# Patient Record
Sex: Female | Born: 2000 | Race: White | Hispanic: No | Marital: Single | Smoking: Never smoker
Health system: Southern US, Community
[De-identification: ages and names within clinical notes are randomized; demographics above are authoritative.]

---
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
## 2020-06-02 ENCOUNTER — Other Ambulatory Visit: Payer: Self-pay | Admitting: Physician Assistant

## 2020-06-02 DIAGNOSIS — N926 Irregular menstruation, unspecified: Secondary | ICD-10-CM

## 2020-06-02 DIAGNOSIS — Z3009 Encounter for other general counseling and advice on contraception: Secondary | ICD-10-CM

## 2020-09-26 ENCOUNTER — Telehealth: Payer: Self-pay | Admitting: Neurology

## 2020-09-26 ENCOUNTER — Other Ambulatory Visit (HOSPITAL_BASED_OUTPATIENT_CLINIC_OR_DEPARTMENT_OTHER): Payer: Self-pay

## 2020-09-26 DIAGNOSIS — N926 Irregular menstruation, unspecified: Secondary | ICD-10-CM

## 2020-09-26 DIAGNOSIS — Z3009 Encounter for other general counseling and advice on contraception: Secondary | ICD-10-CM

## 2020-09-26 MED ORDER — NORETHIN ACE-ETH ESTRAD-FE 1-20 MG-MCG PO TABS
1.0000 | ORAL_TABLET | Freq: Every day | ORAL | 0 refills | Status: DC
  Filled 2020-09-26: qty 28, 28d supply, fill #0

## 2020-09-26 NOTE — Telephone Encounter (Signed)
Patient left vm asking for refill on birth control. 1 month supply sent. Not seen since October 2020. Please call patient to schedule follow up.

## 2020-09-26 NOTE — Telephone Encounter (Signed)
Patient has been scheduled for 10/03/2020. AM

## 2020-09-27 ENCOUNTER — Other Ambulatory Visit (HOSPITAL_BASED_OUTPATIENT_CLINIC_OR_DEPARTMENT_OTHER): Payer: Self-pay

## 2020-10-03 ENCOUNTER — Other Ambulatory Visit: Payer: Self-pay

## 2020-10-03 ENCOUNTER — Ambulatory Visit (INDEPENDENT_AMBULATORY_CARE_PROVIDER_SITE_OTHER): Payer: No Typology Code available for payment source | Admitting: Physician Assistant

## 2020-10-03 ENCOUNTER — Encounter: Payer: Self-pay | Admitting: Physician Assistant

## 2020-10-03 VITALS — BP 124/65 | HR 86 | Temp 98.8°F | Wt 166.0 lb

## 2020-10-03 DIAGNOSIS — R635 Abnormal weight gain: Secondary | ICD-10-CM | POA: Diagnosis not present

## 2020-10-03 DIAGNOSIS — N926 Irregular menstruation, unspecified: Secondary | ICD-10-CM

## 2020-10-03 DIAGNOSIS — E663 Overweight: Secondary | ICD-10-CM

## 2020-10-03 DIAGNOSIS — Z1329 Encounter for screening for other suspected endocrine disorder: Secondary | ICD-10-CM

## 2020-10-03 DIAGNOSIS — Z3009 Encounter for other general counseling and advice on contraception: Secondary | ICD-10-CM

## 2020-10-03 DIAGNOSIS — Z0000 Encounter for general adult medical examination without abnormal findings: Secondary | ICD-10-CM | POA: Diagnosis not present

## 2020-10-03 DIAGNOSIS — F419 Anxiety disorder, unspecified: Secondary | ICD-10-CM

## 2020-10-03 DIAGNOSIS — Z131 Encounter for screening for diabetes mellitus: Secondary | ICD-10-CM

## 2020-10-03 MED ORDER — NORETHIN ACE-ETH ESTRAD-FE 1-20 MG-MCG PO TABS: 1.0000 | ORAL_TABLET | Freq: Every day | ORAL | 3 refills | Status: DC

## 2020-10-03 NOTE — Patient Instructions (Signed)
Health Maintenance, Female Adopting a healthy lifestyle and getting preventive care are important in promoting health and wellness. Ask your health care provider about:  The right schedule for you to have regular tests and exams.  Things you can do on your own to prevent diseases and keep yourself healthy. What should I know about diet, weight, and exercise? Eat a healthy diet  Eat a diet that includes plenty of vegetables, fruits, low-fat dairy products, and lean protein.  Do not eat a lot of foods that are high in solid fats, added sugars, or sodium.   Maintain a healthy weight Body mass index (BMI) is used to identify weight problems. It estimates body fat based on height and weight. Your health care provider can help determine your BMI and help you achieve or maintain a healthy weight. Get regular exercise Get regular exercise. This is one of the most important things you can do for your health. Most adults should:  Exercise for at least 150 minutes each week. The exercise should increase your heart rate and make you sweat (moderate-intensity exercise).  Do strengthening exercises at least twice a week. This is in addition to the moderate-intensity exercise.  Spend less time sitting. Even light physical activity can be beneficial. Watch cholesterol and blood lipids Have your blood tested for lipids and cholesterol at 20 years of age, then have this test every 5 years. Have your cholesterol levels checked more often if:  Your lipid or cholesterol levels are high.  You are older than 20 years of age.  You are at high risk for heart disease. What should I know about cancer screening? Depending on your health history and family history, you may need to have cancer screening at various ages. This may include screening for:  Breast cancer.  Cervical cancer.  Colorectal cancer.  Skin cancer.  Lung cancer. What should I know about heart disease, diabetes, and high blood  pressure? Blood pressure and heart disease  High blood pressure causes heart disease and increases the risk of stroke. This is more likely to develop in people who have high blood pressure readings, are of African descent, or are overweight.  Have your blood pressure checked: ? Every 3-5 years if you are 18-39 years of age. ? Every year if you are 40 years old or older. Diabetes Have regular diabetes screenings. This checks your fasting blood sugar level. Have the screening done:  Once every three years after age 40 if you are at a normal weight and have a low risk for diabetes.  More often and at a younger age if you are overweight or have a high risk for diabetes. What should I know about preventing infection? Hepatitis B If you have a higher risk for hepatitis B, you should be screened for this virus. Talk with your health care provider to find out if you are at risk for hepatitis B infection. Hepatitis C Testing is recommended for:  Everyone born from 1945 through 1965.  Anyone with known risk factors for hepatitis C. Sexually transmitted infections (STIs)  Get screened for STIs, including gonorrhea and chlamydia, if: ? You are sexually active and are younger than 20 years of age. ? You are older than 20 years of age and your health care provider tells you that you are at risk for this type of infection. ? Your sexual activity has changed since you were last screened, and you are at increased risk for chlamydia or gonorrhea. Ask your health care provider   if you are at risk.  Ask your health care provider about whether you are at high risk for HIV. Your health care provider may recommend a prescription medicine to help prevent HIV infection. If you choose to take medicine to prevent HIV, you should first get tested for HIV. You should then be tested every 3 months for as long as you are taking the medicine. Pregnancy  If you are about to stop having your period (premenopausal) and  you may become pregnant, seek counseling before you get pregnant.  Take 400 to 800 micrograms (mcg) of folic acid every day if you become pregnant.  Ask for birth control (contraception) if you want to prevent pregnancy. Osteoporosis and menopause Osteoporosis is a disease in which the bones lose minerals and strength with aging. This can result in bone fractures. If you are 65 years old or older, or if you are at risk for osteoporosis and fractures, ask your health care provider if you should:  Be screened for bone loss.  Take a calcium or vitamin D supplement to lower your risk of fractures.  Be given hormone replacement therapy (HRT) to treat symptoms of menopause. Follow these instructions at home: Lifestyle  Do not use any products that contain nicotine or tobacco, such as cigarettes, e-cigarettes, and chewing tobacco. If you need help quitting, ask your health care provider.  Do not use street drugs.  Do not share needles.  Ask your health care provider for help if you need support or information about quitting drugs. Alcohol use  Do not drink alcohol if: ? Your health care provider tells you not to drink. ? You are pregnant, may be pregnant, or are planning to become pregnant.  If you drink alcohol: ? Limit how much you use to 0-1 drink a day. ? Limit intake if you are breastfeeding.  Be aware of how much alcohol is in your drink. In the [place] one drink equals one 12 oz bottle of beer (355 mL), one 5 oz glass of wine (148 mL), or one 1 oz glass of hard liquor (44 mL). General instructions  Schedule regular health, dental, and eye exams.  Stay current with your vaccines.  Tell your health care provider if: ? You often feel depressed. ? You have ever been abused or do not feel safe at home. Summary  Adopting a healthy lifestyle and getting preventive care are important in promoting health and wellness.  Follow your health care provider's instructions about healthy  diet, exercising, and getting tested or screened for diseases.  Follow your health care provider's instructions on monitoring your cholesterol and blood pressure. This information is not intended to replace advice given to you by your health care provider. Make sure you discuss any questions you have with your health care provider. Document Revised: 06/01/2018 Document Reviewed: 06/01/2018 Elsevier Patient Education  2021 Elsevier Inc.  

## 2020-10-03 NOTE — Progress Notes (Signed)
Subjective:    Patient ID: Michelle [facility]dwell, female    DOB: [birth year], 19 y.o.   MRN: [id number]  HPI  Pt is a 20 yo female who presents to the clinic for birth control refills.   She does not really like OCP. It does well with cycle but she feels like it causes her to gain weight and makes her a little more anxious. She does need it for pregnancy prevention. She is sexually active with one partner.   Overall she is a little frustrated with her weight. She is in college but she "eats healthy" and exercises at least 3 times a week. She continues to slowly gain.  .. Active Ambulatory Problems    Diagnosis Date Noted  . Papule 03/29/2016  . 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 Dysmenorrhea 08/22/2018  . Tachycardia 08/23/2018  . Chest pain, exertional 08/23/2018  . Birth control counseling 10/03/2020  . Abnormal weight gain 10/07/2020  . Menstrual periods irregular 10/07/2020  . Overweight (BMI 25.0-29.9) 10/07/2020   Resolved Ambulatory Problems    Diagnosis Date Noted  . Acute pharyngitis 01/07/2011  . EXANTHEM 01/07/2011  . Right rotator cuff tendinitis 12/27/2015   No Additional Past Medical History     Review of Systems  All other systems reviewed and are negative.      Objective:   Physical Exam Vitals reviewed.  Constitutional:      Appearance: Normal appearance.  Cardiovascular:     Rate and Rhythm: Normal rate and regular rhythm.     Pulses: Normal pulses.     Heart sounds: Normal heart sounds.  Pulmonary:     Effort: Pulmonary effort is normal.     Breath sounds: Normal breath sounds.  Abdominal:     General: Bowel sounds are normal. There is no distension.     Palpations: Abdomen is soft. There is no mass.     Tenderness: There is no abdominal tenderness. There is no right CVA tenderness, left CVA tenderness or guarding.  Musculoskeletal:     Cervical back: Normal range of motion. No tenderness.  Lymphadenopathy:     Cervical: No  cervical adenopathy.  Neurological:     General: No focal deficit present.     Mental Status: She is alert and oriented to person, place, and time.  Psychiatric:        Mood and Affect: Mood normal.     .. Depression screen [facility] 2/9 10/07/2020 04/12/2019  Decreased Interest 0 0  Down, Depressed, Hopeless 0 0  PHQ - 2 Score 0 0  Altered sleeping 0 0  Tired, decreased energy 0 0  Change in appetite 0 0  Feeling bad or failure about yourself  0 0  Trouble concentrating 0 0  Moving slowly or fidgety/restless 0 0  Suicidal thoughts 0 0  PHQ-9 Score 0 0  Difficult doing work/chores Not difficult at all Not difficult at all         Assessment & Plan:  Marland KitchenMarland KitchenKylie was seen today for contraception.  Diagnoses and all orders for this visit:  Preventative health care -     TSH -     CBC with Differential/Platelet -     COMPLETE METABOLIC PANEL WITH GFR  Birth control counseling -     norethindrone-ethinyl estradiol (LARIN FE 1/20) 1-20 MG-MCG tablet; Take 1 tablet by mouth daily. appt for further refills  Screening for thyroid disorder -     TSH  Abnormal weight gain -  TSH -     CBC with Differential/Platelet -     COMPLETE METABOLIC PANEL WITH GFR  Screening for diabetes mellitus -     COMPLETE METABOLIC PANEL WITH GFR  Menstrual periods irregular -     norethindrone-ethinyl estradiol (LARIN FE 1/20) 1-20 MG-MCG tablet; Take 1 tablet by mouth daily. appt for further refills  Overweight (BMI 25.0-29.9)   .Marland Kitchen Discussed 150 minutes of exercise a week.  Encouraged vitamin D 1000 units and Calcium 1300mg  or 4 servings of dairy a day.  PHQ-9 no concerns.  Pap at 21. Declined STD testing today.  Discussed birth control options.  She will consider IUD copper because she does not want hormones.  She lives in [place] and would like referral. She will get back to me where.  Continue on OCP for now.   [place].Discussed low carb diet with 1500 calories and 80g of protein.   Exercising at least 150 minutes a week.  My Fitness Pal could be a Marland Kitchen.  Will check TSH.   Discussed anxiousness. Consider ashwaganda. Get good sleep. Discussed deep breathing and counseling if needed. Follow up if not improving or worsening.

## 2020-10-04 LAB — COMPLETE METABOLIC PANEL WITH GFR
AG Ratio: 1.5 (calc) (ref 1.0–2.5)
ALT: 28 U/L (ref 5–32)
AST: 18 U/L (ref 12–32)
Albumin: 4.3 g/dL (ref 3.6–5.1)
Alkaline phosphatase (APISO): 69 U/L (ref 36–128)
BUN: 10 mg/dL (ref 7–20)
CO2: 26 mmol/L (ref 20–32)
Calcium: 9.7 mg/dL (ref 8.9–10.4)
Chloride: 103 mmol/L (ref 98–110)
Creat: 0.81 mg/dL (ref 0.50–1.00)
GFR, Est African American: 122 mL/min/{1.73_m2} (ref >=60)
GFR, Est Non African American: 105 mL/min/{1.73_m2} (ref >=60)
Globulin: 2.9 g/dL (calc) (ref 2.0–3.8)
Glucose, Bld: 83 mg/dL (ref 65–99)
Potassium: 3.9 mmol/L (ref 3.8–5.1)
Sodium: 140 mmol/L (ref 135–146)
Total Bilirubin: 0.5 mg/dL (ref 0.2–1.1)
Total Protein: 7.2 g/dL (ref 6.3–8.2)

## 2020-10-04 LAB — CBC WITH DIFFERENTIAL/PLATELET
Absolute Monocytes: 634 cells/uL (ref 200–950)
Basophils Absolute: 44 cells/uL (ref 0–200)
Basophils Relative: 0.5 %
Eosinophils Absolute: 141 cells/uL (ref 15–500)
Eosinophils Relative: 1.6 %
HCT: 39 % (ref 35.0–45.0)
Hemoglobin: 12.7 g/dL (ref 11.7–15.5)
Lymphs Abs: 2719 cells/uL (ref 850–3900)
MCH: 27.5 pg (ref 27.0–33.0)
MCHC: 32.6 g/dL (ref 32.0–36.0)
MCV: 84.4 fL (ref 80.0–100.0)
MPV: 9.9 fL (ref 7.5–12.5)
Monocytes Relative: 7.2 %
Neutro Abs: 5262 cells/uL (ref 1500–7800)
Neutrophils Relative %: 59.8 %
Platelets: 321 10*3/uL (ref 140–400)
RBC: 4.62 10*6/uL (ref 3.80–5.10)
RDW: 12.4 % (ref 11.0–15.0)
Total Lymphocyte: 30.9 %
WBC: 8.8 10*3/uL (ref 3.8–10.8)

## 2020-10-04 LAB — TSH: TSH: 0.71 mIU/L

## 2020-10-07 DIAGNOSIS — E663 Overweight: Secondary | ICD-10-CM | POA: Insufficient documentation

## 2020-10-07 DIAGNOSIS — R635 Abnormal weight gain: Secondary | ICD-10-CM | POA: Insufficient documentation

## 2020-10-07 DIAGNOSIS — N926 Irregular menstruation, unspecified: Secondary | ICD-10-CM | POA: Insufficient documentation

## 2020-10-07 DIAGNOSIS — F419 Anxiety disorder, unspecified: Secondary | ICD-10-CM | POA: Insufficient documentation

## 2020-10-07 NOTE — Progress Notes (Signed)
Adileny,   Your thyroid is in normal range.  No anemia.  Normal liver, kidney, electrolytes.   Labs look great.   Pt was interested in signing up for mychart. Please send her info.

## 2021-02-19 ENCOUNTER — Other Ambulatory Visit: Payer: Self-pay | Admitting: Neurology

## 2021-02-19 DIAGNOSIS — Z3009 Encounter for other general counseling and advice on contraception: Secondary | ICD-10-CM

## 2021-02-19 DIAGNOSIS — N926 Irregular menstruation, unspecified: Secondary | ICD-10-CM

## 2021-02-19 MED ORDER — NORETHIN ACE-ETH ESTRAD-FE 1-20 MG-MCG PO TABS: 1.0000 | ORAL_TABLET | Freq: Every day | ORAL | 2 refills | Status: DC

## 2021-08-03 ENCOUNTER — Other Ambulatory Visit: Payer: Self-pay | Admitting: Physician Assistant

## 2021-08-03 DIAGNOSIS — Z3009 Encounter for other general counseling and advice on contraception: Secondary | ICD-10-CM

## 2021-08-03 DIAGNOSIS — N926 Irregular menstruation, unspecified: Secondary | ICD-10-CM

## 2022-02-02 ENCOUNTER — Telehealth: Payer: Self-pay | Admitting: General Practice

## 2022-02-02 NOTE — Telephone Encounter (Signed)
Transition Care Management Follow-up Telephone Call Date of discharge and from where: 02/02/22 from [facility] How have you been since you were released from the hospital? Patient has moved to [place] but still does not have a PCP close by. Feeling a little bit better. No nausea/ vomiting or blurred vision. Patient scheduled to see PCP tomorrow 02/03/22 Any questions or concerns? No  Items Reviewed: Did the pt receive and understand the discharge instructions provided? Yes  Medications obtained and verified? Yes  Other? No  Any new allergies since your discharge? No  Dietary orders reviewed? Yes Do you have support at home? Yes   Home Care and Equipment/Supplies: Were home health services ordered? no  Functional Questionnaire: (I = Independent and D = Dependent) ADLs: I  Bathing/Dressing- I  Meal Prep- I  Eating- I  Maintaining continence- I  Transferring/Ambulation- I  Managing Meds- I  Follow up appointments reviewed:  PCP Hospital f/u appt confirmed? Yes  Scheduled to see Tandy Gaw PA on 02/03/22 @ 1300. Specialist Hospital f/u appt confirmed? No   Are transportation arrangements needed? No  If their condition worsens, is the pt aware to call PCP or go to the Emergency Dept.? Yes Was the patient provided with contact information for the PCP's office or ED? Yes Was to pt encouraged to call back with questions or concerns? Yes

## 2022-02-03 ENCOUNTER — Encounter: Payer: Self-pay | Admitting: Physician Assistant

## 2022-02-03 ENCOUNTER — Ambulatory Visit (INDEPENDENT_AMBULATORY_CARE_PROVIDER_SITE_OTHER): Payer: No Typology Code available for payment source | Admitting: Physician Assistant

## 2022-02-03 VITALS — BP 121/64 | HR 75 | Temp 98.5°F | Ht 63.0 in | Wt 176.0 lb

## 2022-02-03 DIAGNOSIS — R519 Headache, unspecified: Secondary | ICD-10-CM | POA: Diagnosis not present

## 2022-02-03 DIAGNOSIS — G44311 Acute post-traumatic headache, intractable: Secondary | ICD-10-CM

## 2022-02-03 DIAGNOSIS — F419 Anxiety disorder, unspecified: Secondary | ICD-10-CM | POA: Diagnosis not present

## 2022-02-03 DIAGNOSIS — R112 Nausea with vomiting, unspecified: Secondary | ICD-10-CM | POA: Insufficient documentation

## 2022-02-03 MED ORDER — PROMETHAZINE HCL 25 MG/ML IJ SOLN
25.0000 mg | Freq: Once | INTRAMUSCULAR | Status: AC
  Administered 2022-02-03: 25 mg via INTRAMUSCULAR

## 2022-02-03 MED ORDER — KETOROLAC TROMETHAMINE 10 MG PO TABS: 10.0000 mg | ORAL_TABLET | Freq: Three times a day (TID) | ORAL | 0 refills | Status: AC | PRN

## 2022-02-03 MED ORDER — TRAMADOL HCL 50 MG PO TABS: 50.0000 mg | ORAL_TABLET | Freq: Three times a day (TID) | ORAL | 0 refills | Status: AC | PRN

## 2022-02-03 MED ORDER — KETOROLAC TROMETHAMINE 60 MG/2ML IM SOLN
60.0000 mg | Freq: Once | INTRAMUSCULAR | Status: AC
  Administered 2022-02-03: 60 mg via INTRAMUSCULAR

## 2022-02-03 MED ORDER — DEXAMETHASONE SODIUM PHOSPHATE 10 MG/ML IJ SOLN
10.0000 mg | Freq: Once | INTRAMUSCULAR | Status: AC
  Administered 2022-02-03: 10 mg via INTRAMUSCULAR

## 2022-02-03 MED ORDER — DEXAMETHASONE SODIUM PHOSPHATE 10 MG/ML IJ SOLN: 10.0000 mg | Freq: Once | INTRAMUSCULAR | Status: DC

## 2022-02-03 MED ORDER — ONDANSETRON 8 MG PO TBDP: 8.0000 mg | ORAL_TABLET | Freq: Three times a day (TID) | ORAL | 1 refills | Status: AC | PRN

## 2022-02-03 MED ORDER — BACLOFEN 10 MG PO TABS: 10.0000 mg | ORAL_TABLET | Freq: Every day | ORAL | 0 refills | Status: AC

## 2022-02-03 MED ORDER — HYDROXYZINE HCL 10 MG PO TABS: 10.0000 mg | ORAL_TABLET | Freq: Three times a day (TID) | ORAL | 0 refills | Status: AC | PRN

## 2022-02-03 NOTE — Patient Instructions (Addendum)
Vistaril for anxiety up to three times a day Toradol for headache relief Tramadol for break through pain Baclofen at bedtime Rest and hydrate  Post-Concussion Syndrome  A concussion is a brain injury from a direct hit to the head or body. This hit causes the brain to shake quickly back and forth inside the skull. This can damage brain cells and cause chemical changes in the brain. Concussions are usually not life-threatening but can cause serious symptoms. Post-concussion syndrome is when symptoms that occur after a concussion last longer than normal. These symptoms can last from weeks to months. What are the causes? The cause of this condition is not known. It can happen whether your head injury was mild or severe. What increases the risk? You are more likely to develop this condition if: You are female. You are a child, teen, or young adult. You have had a past head injury. You have a history of headaches. You have depression or anxiety. You have loss of consciousness or cannot remember the event (have amnesia of the event). You have multiple symptoms or severe symptoms at the time of your concussion. What are the signs or symptoms? Symptoms of this condition include: Physical symptoms. You may have: Headaches. Tiredness. Dizziness and weakness. Blurry vision and sensitivity to light. Hearing difficulties. Problems with balance. Mental and emotional symptoms. You may have: Memory problems and trouble concentrating. Difficulty sleeping or staying asleep. Feelings of irritability. Anxiety or depression. Difficulty learning new things. How is this diagnosed? This condition may be diagnosed based on: Your symptoms. A description of your injury. Your medical history. Testing your strength, balance, and nerve function (neurological examination). Your health care provider may order other tests, including brain imaging such as a CT scan or an MRI, and memory testing  (neuropsychological testing). How is this treated? Treatment for this condition may depend on your symptoms. Symptoms usually go away on their own over time. Treatments may include: Medicines for headaches, anxiety, depression, and trouble sleeping (insomnia). Resting your brain and body for a few days after your injury. Rehabilitation therapy, such as: Physical or occupational therapy. This may include exercises to help with balance and dizziness. Mental health counseling. A form of talk therapy called cognitive behavioral therapy (CBT) can be especially helpful. This therapy helps you set goals and follow up on the changes that you make. Speech therapy. Vision therapy. A brain and eye specialist can recommend treatments for vision problems. Follow these instructions at home: Medicines Take over-the-counter and prescription medicines only as told by your health care provider. Avoid opioid prescription pain medicines when recovering from a concussion. Activity Limit your mental activities for the first few days after your injury. This may include not doing the following: Homework or job-related work. Complex thinking. Watching TV, and using a computer or phone. Playing memory games and puzzles. Gradually return to your normal activity level. If a certain activity brings on your symptoms, stop or slow down until you can do the activity without it triggering your symptoms. Limit physical activity, such as exercise or sports, for the first few days after a concussion. Gradually return to normal activity as told by your health care provider. Rest. Rest helps your brain heal. Make sure you: Get plenty of sleep at night. Most adults should get at least 7-9 hours of sleep each night. Rest during the day. Take naps or rest breaks when you feel tired. Do not do high-risk activities that could cause a second concussion, such as riding a  bike or playing sports. Having another concussion before the  first one has healed can be dangerous. General instructions  Do not drink alcohol until your health care provider says that you can. Keep track of the frequency and the severity of your symptoms. Give this information to your health care provider. Keep all follow-up visits as directed by your health care provider. This is important. This includes visits with specialists. Contact a health care provider if: Your symptoms do not improve. You have another injury. Get help right away if you: Have a severe or worsening headache. Are confused. Have trouble staying awake. Faint. Vomit. Have weakness or numbness in any part of your body. Have a seizure. Have trouble speaking. Summary Post-concussion syndrome is when symptoms that occur after a concussion last longer than normal. Symptoms usually go away on their own over time. Depending on your symptoms, you may need treatment, such as medicines or rehabilitation therapy. Rest your brain and body for a few days after your injury. Gradually return to normal activities as told by your health care provider. Get plenty of sleep, and avoid alcohol and opioid pain medicines while recovering from a concussion. This information is not intended to replace advice given to you by your health care provider. Make sure you discuss any questions you have with your health care provider. Document Revised: 08/22/2020 Document Reviewed: 08/22/2020 Elsevier Patient Education  2023 [employer].

## 2022-02-03 NOTE — Progress Notes (Signed)
Established Patient Office Visit  Subjective   Patient ID: Michelle Caldwell, female    DOB: Apr 10, 2001  Age: 21 y.o. MRN: [id number]  Chief Complaint  Patient presents with   Hospitalization Follow-up    concussion    HPI Pt is a 21 yo female who presents to the clinic with mother to discuss severe frontal headache and follow up from ED on 02/01/2022.   On 01/25/2022 she went to a concert and got drunk. Her roommate said at some point she fell and hit her head but does not know any details. The next morning the patient woke up and had a dull frontal headache and felt terrible. The headache has been getting worse since then. She came home to her mother who is a Engineer, civil (consulting) and started giving her 800mg  of ibuprofen alternated with tylenol which "took the edge off". They went to the beach and came back and frontal headache persisted and seemed to worsen. She was 10/10 pain on 13th and went to ED. CT of head was negative for any acute findings. CBC/CMP negative for any signs of infection. Migraine cocktail was given and some fluids. She did start to feel better but today she woke up with a terrible frontal headache. She is nauseated and has vomited. She is very light sensitive. Her head is not tender to touch. No URI symptoms. No arm or leg weakness. No speech changes.   Pt does report she had botox 14 days before her headache started from a friend who works in botox clinic. She feels like this could be botox side effect.  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 No pertinent past medical history. Family History  Problem Relation Age of Onset   Alcohol abuse Father    No Known Allergies    ROS   See HPI.  Objective:     BP 121/64 (BP Location: Right Arm, Patient Position: Sitting, Cuff Size: Normal)   Pulse 75   Temp 98.5 F (36.9 C) (Oral)   Ht 5\' 3"  (1.6 m)   Wt 176 lb 0.6 oz (79.9 kg)   SpO2 99%   BMI 31.18 kg/m  BP Readings from Last 3 Encounters:  02/03/22 121/64  10/03/20 124/65  05/09/19 106/69   Wt Readings from Last 3 Encounters:  02/03/22 176 lb 0.6 oz (79.9 kg)  10/03/20 166 lb (75.3 kg) (90 %, Z= 1.28)*  04/12/19 166 lb (75.3 kg) (92 %, Z= 1.38)*   * Growth percentiles are based on CDC (Girls, 2-20 Years) data.      Physical Exam Constitutional:      Appearance: Normal appearance.  HENT:     Head: Normocephalic.     Right Ear: Tympanic membrane normal.     Left Ear: Tympanic membrane normal.     Ears:     Comments: No frontal tenderness    Nose: No congestion or rhinorrhea.     Mouth/Throat:     Mouth: Mucous membranes are moist.     Pharynx: No oropharyngeal  exudate or posterior oropharyngeal erythema.  Eyes:     General:        Right eye: No discharge.        Left eye: No discharge.     Extraocular Movements: Extraocular movements intact.     Conjunctiva/sclera: Conjunctivae normal.     Pupils: Pupils are equal, round, and reactive to light.  Neck:     Vascular: No carotid bruit.  Cardiovascular:     Rate and Rhythm: Normal rate.  Pulmonary:     Effort: Pulmonary effort is normal.  Musculoskeletal:     Cervical back: Normal range of motion and neck supple. No rigidity or tenderness.     Right lower leg: No edema.     Left lower leg: No edema.  Lymphadenopathy:     Cervical: No cervical adenopathy.  Neurological:     General: No focal deficit present.     Mental Status: She is alert and oriented to person, place, and time.      Cranial Nerves: No cranial nerve deficit.     Sensory: No sensory deficit.     Motor: No weakness.     Coordination: Coordination normal.     Gait: Gait normal.  Psychiatric:     Comments: anxious        Assessment & Plan:  Marland KitchenMarland KitchenKylie was seen today for hospitalization follow-up.  Diagnoses and all orders for this visit:  Severe frontal headaches -     ketorolac (TORADOL) 10 MG tablet; Take 1 tablet (10 mg total) by mouth every 8 (eight) hours as needed. -     traMADol (ULTRAM) 50 MG tablet; Take 1 tablet (50 mg total) by mouth every 8 (eight) hours as needed for up to 5 days.  Acute anxiety -     hydrOXYzine (ATARAX) 10 MG tablet; Take 1 tablet (10 mg total) by mouth 3 (three) times daily as needed.  Intractable acute post-traumatic headache -     ketorolac (TORADOL) 10 MG tablet; Take 1 tablet (10 mg total) by mouth every 8 (eight) hours as needed. -     traMADol (ULTRAM) 50 MG tablet; Take 1 tablet (50 mg total) by mouth every 8 (eight) hours as needed for up to 5 days.  Nausea and vomiting, unspecified vomiting type -     ondansetron (ZOFRAN-ODT) 8 MG disintegrating tablet; Take 1 tablet (8 mg total) by mouth every 8 (eight) hours as needed for nausea.   Post traumatic Headache vs botox side effects Discussed so rare for botox to make headaches worse No signs of infection  Reassuring vitals look good and CT and labs all looked good on 13th in ED She is in 10/10 grimacing pain Decadron 10mg /Toradol 60mg /phenergan 12.5mg  given IM in office today Toradol to replace ibuprofen Ice packs on face Zofran for nausea and vomiting Baclofen to use at bedtime for tension Tramadol for break through pain  . PDMP reviewed during this encounter. Pt aware to use sparingly. Vistaril to help with anxiety about pain up to three times a day Stay hydrated Follow up in 1 week or sooner if headache worsening or not improving.    , PA-C

## 2022-02-04 ENCOUNTER — Inpatient Hospital Stay: Payer: No Typology Code available for payment source | Admitting: Family Medicine

## 2022-11-04 DIAGNOSIS — F419 Anxiety disorder, unspecified: Secondary | ICD-10-CM | POA: Diagnosis not present

## 2022-11-11 DIAGNOSIS — F419 Anxiety disorder, unspecified: Secondary | ICD-10-CM | POA: Diagnosis not present

## 2022-11-11 DIAGNOSIS — L718 Other rosacea: Secondary | ICD-10-CM | POA: Diagnosis not present

## 2022-11-11 DIAGNOSIS — L02222 Furuncle of back [any part, except buttock]: Secondary | ICD-10-CM | POA: Diagnosis not present

## 2022-11-11 DIAGNOSIS — D2239 Melanocytic nevi of other parts of face: Secondary | ICD-10-CM | POA: Diagnosis not present

## 2022-11-17 ENCOUNTER — Other Ambulatory Visit (HOSPITAL_BASED_OUTPATIENT_CLINIC_OR_DEPARTMENT_OTHER): Payer: Self-pay

## 2022-11-17 MED ORDER — METRONIDAZOLE 0.75 % EX CREA
1.0000 | TOPICAL_CREAM | Freq: Two times a day (BID) | CUTANEOUS | 11 refills | Status: AC
  Filled 2022-11-17: qty 45, 23d supply, fill #0

## 2022-11-18 DIAGNOSIS — F419 Anxiety disorder, unspecified: Secondary | ICD-10-CM | POA: Diagnosis not present

## 2022-11-23 ENCOUNTER — Other Ambulatory Visit (HOSPITAL_BASED_OUTPATIENT_CLINIC_OR_DEPARTMENT_OTHER): Payer: Self-pay

## 2022-11-25 DIAGNOSIS — F419 Anxiety disorder, unspecified: Secondary | ICD-10-CM | POA: Diagnosis not present

## 2022-12-02 DIAGNOSIS — F419 Anxiety disorder, unspecified: Secondary | ICD-10-CM | POA: Diagnosis not present

## 2022-12-08 DIAGNOSIS — F419 Anxiety disorder, unspecified: Secondary | ICD-10-CM | POA: Diagnosis not present

## 2022-12-19 DIAGNOSIS — F419 Anxiety disorder, unspecified: Secondary | ICD-10-CM | POA: Diagnosis not present

## 2023-01-02 DIAGNOSIS — F419 Anxiety disorder, unspecified: Secondary | ICD-10-CM | POA: Diagnosis not present

## 2023-01-24 DIAGNOSIS — F419 Anxiety disorder, unspecified: Secondary | ICD-10-CM | POA: Diagnosis not present

## 2023-01-31 DIAGNOSIS — F419 Anxiety disorder, unspecified: Secondary | ICD-10-CM | POA: Diagnosis not present

## 2023-02-05 DIAGNOSIS — F419 Anxiety disorder, unspecified: Secondary | ICD-10-CM | POA: Diagnosis not present

## 2023-02-10 DIAGNOSIS — F419 Anxiety disorder, unspecified: Secondary | ICD-10-CM | POA: Diagnosis not present

## 2023-03-04 DIAGNOSIS — F419 Anxiety disorder, unspecified: Secondary | ICD-10-CM | POA: Diagnosis not present
